# Patient Record
Sex: Male | Born: 1995 | Race: White | Hispanic: No | Marital: Single | Smoking: Never smoker
Health system: Southern US, Community
[De-identification: ages and names within clinical notes are randomized; demographics above are authoritative.]

## PROBLEM LIST (undated history)

## (undated) DIAGNOSIS — I639 Cerebral infarction, unspecified: Secondary | ICD-10-CM

## (undated) HISTORY — PX: HYDROCELE EXCISION: SHX482

---
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
## 2018-02-09 ENCOUNTER — Encounter: Payer: Self-pay | Admitting: Emergency Medicine

## 2018-02-09 DIAGNOSIS — F902 Attention-deficit hyperactivity disorder, combined type: Secondary | ICD-10-CM

## 2018-02-17 ENCOUNTER — Ambulatory Visit (INDEPENDENT_AMBULATORY_CARE_PROVIDER_SITE_OTHER): Payer: 59 | Admitting: Psychiatry

## 2018-02-17 ENCOUNTER — Encounter: Payer: Self-pay | Admitting: Psychiatry

## 2018-02-17 VITALS — BP 108/64 | HR 60 | Ht 70.0 in | Wt 142.0 lb

## 2018-02-17 DIAGNOSIS — F902 Attention-deficit hyperactivity disorder, combined type: Secondary | ICD-10-CM

## 2018-02-17 DIAGNOSIS — F88 Other disorders of psychological development: Secondary | ICD-10-CM

## 2018-02-17 MED ORDER — AMPHETAMINE-DEXTROAMPHETAMINE 15 MG PO TABS: 15.0000 mg | ORAL_TABLET | Freq: Two times a day (BID) | ORAL | 0 refills | Status: DC

## 2018-02-17 NOTE — Progress Notes (Signed)
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  He is now a Holiday representative at [employer], and sister has transferred there.  He [facility] [place]  His weight is back up after a loss of 5 pounds last appointment, now with gain of 9 pounds rendering Adderall comfortable current dosing which he creatively applies to his academic schedule for optimal success with least medication.  He did turn 22 years and has had an occasional beer is in his fraternity not currently using.  He has no vaping or other substance use.   Individual Medical History/ Review of Systems: Changes? :Yes Asthma and urticaria are stable being allergic to penicillin and albuterol, having cerebral concussion 6 years ago and a mild essential tremor particularly left more than right hand when outstretched, otherwise 2 systems negative.  He has no medications prior to Adderall.  Allergies: Albuterol and Penicillins  Current Medications:  Current Outpatient Medications:  .  amphetamine-dextroamphetamine (ADDERALL) 15 MG tablet, Take 1 tablet by mouth 2 (two) times daily., Disp: 60 tablet, Rfl: 0 .  [START ON 03/19/2018] amphetamine-dextroamphetamine (ADDERALL) 15 MG tablet, Take 1 tablet by mouth 2 (two) times daily., Disp: 60 tablet, Rfl: 0 .  [START ON 04/18/2018] amphetamine-dextroamphetamine (ADDERALL) 15 MG  tablet, Take 1 tablet by mouth 2 (two) times daily., Disp: 60 tablet, Rfl: 0 Medication Side Effects: none  Family Medical/ Social History: Changes?  Yes.  Younger sister has celiac, ADHD, anxiety and tics.  Mother has depression and ADHD.  Maternal grandfather completed suicide.  MENTAL HEALTH EXAM: Muscle strength 5/5, postural reflexes 0/0, 1/2+ hand tremor left more than right, and AIMS equals 0 Blood pressure 108/64, pulse 60, height 5\' 10"  (1.778 m), weight 142 lb (64.4 kg).Body mass index is 20.37 kg/m.  General Appearance: Casual and Fairly Groomed  Eye Contact:  Good  Speech:  Clear and Coherent  Volume:  Normal  Mood:  Euthymic  Affect:  Full Range  Thought Process:  Goal Directed  Orientation:  Full (Time, Place, and Person)  Thought Content: Rumination   Suicidal Thoughts:  No  Homicidal Thoughts:  No  Memory:  Immediate;   Fair  Judgement:  Good  Insight:  Fair  Psychomotor Activity:  Increased and Mannerisms  Concentration:  Concentration: Good and Attention Span: Fair  Recall:  [employer] of Knowledge: Good  Language: Good  Assets:  Resilience Talents/Skills Vocational/Educational  ADL's:  Intact  Cognition: WNL  Prognosis:  Good    DIAGNOSES:    ICD-10-CM   1. Attention deficit hyperactivity disorder, combined type F90.2 amphetamine-dextroamphetamine (ADDERALL) 15 MG tablet    amphetamine-dextroamphetamine (ADDERALL) 15 MG tablet    amphetamine-dextroamphetamine (ADDERALL) 15 MG tablet  2. Secondary neurodevelopmental disorder F88     Receiving Psychotherapy: No    RECOMMENDATIONS: We update all issues for completing his junior year with succesful academics to also have adaptive applications.  He processes family, fraternity, academic, and pre-employment relations and activities for any modifications.  He is prescribed to continue Adderall 15 mg IR tablet twice daily as a month supply each for November, December, and [facility] [place], to  return in 6 months   Chauncey Mann, MD

## 2018-07-28 ENCOUNTER — Telehealth: Payer: Self-pay | Admitting: Psychiatry

## 2018-07-28 DIAGNOSIS — F902 Attention-deficit hyperactivity disorder, combined type: Secondary | ICD-10-CM

## 2018-07-28 MED ORDER — AMPHETAMINE-DEXTROAMPHETAMINE 15 MG PO TABS: 15.0000 mg | ORAL_TABLET | Freq: Two times a day (BID) | ORAL | 0 refills | Status: DC

## 2018-07-28 NOTE — Addendum Note (Signed)
Addended by: Chauncey Mann on: 07/28/2018 03:33 PM   Modules accepted: Orders

## 2018-07-28 NOTE — Telephone Encounter (Signed)
Last appointment 02/18/2019 due again next month home from [employer] needing Adderall for remainder of semester online 20 mg IR tab twice daily #60 with no refill medically necessary with no contraindication [facility] [address]

## 2018-07-28 NOTE — Telephone Encounter (Signed)
Patient need refill on Adderall 15 mg. Instant release to be transferred to [facility] on [address]. patient is home from school

## 2018-11-13 ENCOUNTER — Ambulatory Visit: Payer: 59 | Admitting: Psychiatry

## 2018-11-17 ENCOUNTER — Encounter: Payer: Self-pay | Admitting: Psychiatry

## 2018-11-17 ENCOUNTER — Other Ambulatory Visit: Payer: Self-pay

## 2018-11-17 ENCOUNTER — Ambulatory Visit (INDEPENDENT_AMBULATORY_CARE_PROVIDER_SITE_OTHER): Payer: 59 | Admitting: Psychiatry

## 2018-11-17 DIAGNOSIS — F902 Attention-deficit hyperactivity disorder, combined type: Secondary | ICD-10-CM | POA: Diagnosis not present

## 2018-11-17 DIAGNOSIS — F88 Other disorders of psychological development: Secondary | ICD-10-CM

## 2018-11-17 MED ORDER — AMPHETAMINE-DEXTROAMPHETAMINE 15 MG PO TABS: 15.0000 mg | ORAL_TABLET | Freq: Two times a day (BID) | ORAL | 0 refills | Status: DC

## 2018-11-17 NOTE — Progress Notes (Signed)
[facility]  Patient ID: Dustin Jennings,  MRN: [id number]  PCP: Patient, No Pcp Per  Date of Evaluation: 11/17/2018 Time spent:10 minutes from 1450 to 1500  Chief Complaint:  Chief Complaint    ADHD; Stress      HISTORY/CURRENT STATUS: Dustin Jennings is provided telemedicine audiovisual appointment session, though he declines video being at his fraternity house though with adequate privacy, with consent with epic collateral for psychiatric interview and exam in 9-month evaluation and management of ADHD with incidental neurodevelopmental disorder of perinatal stroke.  He is 3 months overdue for follow-up but last fill of Adderall [place] was 07/28/2018 having the summer off from school and medication.  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  He moved into the fraternity house already, and 1 pledge brother had COVID exposure patient not having test but reviewing medically with his provider.  With the coronavirus pandemic, he prefers [facility].  He has no psychosis, mania, substance use, or delirium disorder.   Individual Medical History/ Review of Systems: Changes? :Yes  Weight gain noted 9 pounds doing well physically though having potential COVID exposure through the fraternity medically reviewed and cleared by [facility] urgent care.  Allergies: Albuterol and Penicillins  Current Medications:  Current Outpatient Medications:  .  amphetamine-dextroamphetamine (ADDERALL) 15 MG tablet, Take 1 tablet by mouth 2 (two) times daily., Disp: 60 tablet, Rfl: 0 .  amphetamine-dextroamphetamine (ADDERALL) 15 MG tablet, Take 1 tablet by mouth 2 (two) times daily., Disp: 60 tablet, Rfl: 0 .  amphetamine-dextroamphetamine (ADDERALL) 15 MG tablet, Take 1 tablet by mouth 2 (two) times daily for 30 days., Disp: 60 tablet, Rfl: 0   Medication Side Effects: none  Family Medical/ Social History: Changes? No  MENTAL HEALTH EXAM:  There were no vitals taken for  this visit.There is no height or weight on file to calculate BMI.  As not present in office  General Appearance: N/A  Eye Contact:  N/A  Speech:  Clear and Coherent, Normal Rate and Talkative  Volume:  Normal  Mood:  Euthymic  Affect:  Appropriate and Full Range  Thought Process:  Goal Directed  Orientation:  Full (Time, Place, and Person)  Thought Content: Logical and Obsessions   Suicidal Thoughts:  No  Homicidal Thoughts:  No  Memory:  Immediate;   Good Remote;   Good  Judgement:  Good  Insight:  Fair  Psychomotor Activity:  Normal, Increased and Mannerisms  Concentration:  Concentration: Good and Attention Span: Fair  Recall:  Good  Fund of Knowledge: Good  Language: Good  Assets:  Desire for Improvement Leisure Time Vocational/Educational  ADL's:  Intact  Cognition: WNL  Prognosis:  Good    DIAGNOSES:    ICD-10-CM   1. Attention deficit hyperactivity disorder, combined type  F90.2   2. Secondary neurodevelopmental disorder  F88     Receiving Psychotherapy: No    RECOMMENDATIONS: Psychosupportive psychoeducation updates medication and academic behavioral management of senior year and beyond.  Symptom treatment matching concludes to continue Adderall 15 mg IR twice daily E scribed #60 each for August 3, September 2, and October 2 [facility] [address] for ADHD.  He returns in 6 months for follow-up or sooner if needed.  Virtual Visit via Video Note  I connected with Dustin Jennings on 11/17/18 at  2:40 PM EDT by a video enabled telemedicine application and verified that I am speaking with the  correct person using two identifiers.  Location: Patient: Individually at fraternity house in private [facility]. Provider: [facility]   I discussed the limitations of evaluation and management by telemedicine and the availability of in person appointments. The patient expressed understanding and agreed to proceed.  History of  Present Illness: 106-month evaluation and management address ADHD with incidental neurodevelopmental disorder of perinatal stroke.  He is 3 months overdue for follow-up but last fill of Adderall per [facility] registry was 07/28/2018 having the summer off from school and medication.   Observations/Objective: Memory:  Immediate;   Good Remote;   Good  Judgement:  Good  Insight:  Fair  Psychomotor Activity:  Normal, Increased and Mannerisms  Concentration:  Concentration: Good and Attention Span: Fair   Assessment and Plan: Psychosupportive psychoeducation updates medication and academic behavioral management of senior year and beyond.  Symptom treatment matching concludes to continue Adderall 15 mg IR twice daily E scribed #60 each for August 3, September 2, and October [facility] [address] for ADHD.  Follow up: He returns in 6 months for follow-up or sooner if needed.    I discussed the assessment and treatment plan with the patient. The patient was provided an opportunity to ask questions and all were answered. The patient agreed with the plan and demonstrated an understanding of the instructions.   The patient was advised to call back or seek an in-person evaluation if the symptoms worsen or if the condition fails to improve as anticipated.  I provided 10 minutes of non-face-to-face time during this encounter. [employer] meeting #[id number]#[phone] Meeting password: [email or web address]  Dustin MannGlenn E Jennings, MD   Dustin MannGlenn E Jennings, MD

## 2019-02-10 ENCOUNTER — Telehealth: Payer: Self-pay | Admitting: Psychiatry

## 2019-02-11 NOTE — Telephone Encounter (Signed)
error 

## 2019-04-28 ENCOUNTER — Other Ambulatory Visit: Payer: Managed Care, Other (non HMO)

## 2019-04-29 ENCOUNTER — Ambulatory Visit: Payer: Managed Care, Other (non HMO) | Attending: Internal Medicine

## 2019-04-29 DIAGNOSIS — Z20822 Contact with and (suspected) exposure to covid-19: Secondary | ICD-10-CM

## 2019-04-30 LAB — NOVEL CORONAVIRUS, NAA: SARS-CoV-2, NAA: NOT DETECTED

## 2019-10-22 ENCOUNTER — Other Ambulatory Visit: Payer: Self-pay

## 2019-10-22 ENCOUNTER — Encounter: Payer: Self-pay | Admitting: Psychiatry

## 2019-10-22 ENCOUNTER — Ambulatory Visit (INDEPENDENT_AMBULATORY_CARE_PROVIDER_SITE_OTHER): Payer: 59 | Admitting: Psychiatry

## 2019-10-22 VITALS — Ht 70.0 in | Wt 152.0 lb

## 2019-10-22 DIAGNOSIS — F902 Attention-deficit hyperactivity disorder, combined type: Secondary | ICD-10-CM

## 2019-10-22 DIAGNOSIS — F88 Other disorders of psychological development: Secondary | ICD-10-CM | POA: Diagnosis not present

## 2019-10-22 MED ORDER — AMPHETAMINE-DEXTROAMPHETAMINE 15 MG PO TABS: 15.0000 mg | ORAL_TABLET | Freq: Two times a day (BID) | ORAL | 0 refills | Status: DC

## 2019-10-22 NOTE — Progress Notes (Signed)
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  Patient continues to adapt effectively graduating in May from his undergraduate status at [employer] now getting a masters in accounting that is consolidated to a single year to graduate in June 2022.  In his senior year, he had only 3 courses.  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  He has an 8 AM class in his masters program and does need Adderall for that although he has filled the third of the 3 eScription's from 12/14/2018 appointment on 05/07/2019 therefore needing progressively less Adderall over the time course of college.  He finds the current dose has been more helpful than he expected, initiating and completing daily work taking the Adderall 15 mg IR twice daily, which he doubted would be sufficient initially. He has no mania, suicidality, psychosis or delirium.   Individual Medical History/ Review of Systems: Changes? :Yes 10 pound weight gain in the last 15 months feeling and functioning well though currently having months to vision over cognitive testing moving out to campus housing with 7 fraternity brothers until graduation now in a new current residence working on his masters.  Last appointment was online.  Allergies: Albuterol and Penicillins  Current Medications:  Current Outpatient Medications:  .  amphetamine-dextroamphetamine (ADDERALL) 15 MG tablet, Take 1 tablet by mouth  2 (two) times daily., Disp: 60 tablet, Rfl: 0 .  [START ON 11/21/2019] amphetamine-dextroamphetamine (ADDERALL) 15 MG tablet, Take 1 tablet by mouth 2 (two) times daily., Disp: 60 tablet, Rfl: 0 .  [START ON 12/21/2019] amphetamine-dextroamphetamine (ADDERALL) 15 MG tablet, Take 1 tablet by mouth 2 (two) times daily., Disp: 60 tablet, Rfl: 0  Medication Side Effects: none  Family Medical/ Social History: Changes? Yes,  sister doing well having transferred to [facility] [employer] she continues to meet her college education expectations at times in other [place] locations.  MENTAL HEALTH EXAM:  Height 5\' 10"  (1.778 m), weight 152 lb (68.9 kg).Body mass index is 21.81 kg/m. Muscle strengths and tone 5/5, postural reflexes and gait 0/0, and AIMS = 0.  General Appearance: Casual, Meticulous and Well Groomed  Eye Contact:  Good  Speech:  Clear and Coherent, Normal Rate and Talkative  Volume:  Normal  Mood:  Euthymic  Affect:  Full Range  Thought Process:  Goal Directed  Orientation:  Full (Time, Place, and Person)  Thought Content: Logical   Suicidal Thoughts:  No  Homicidal Thoughts:  No  Memory:  Immediate;   Good Remote;   Good  Judgement:  Good  Insight:  Fair  Psychomotor Activity:  Normal and Mannerisms  Concentration:  Concentration: Good and Attention Span: Good to Fair  Recall:  Good  Fund of Knowledge: Good  Language: Good  Assets:  Desire for Improvement Intimacy Resilience Vocational/Educational  ADL's:  Intact  Cognition: WNL  Prognosis:  Good    DIAGNOSES:    ICD-10-CM   1. Attention deficit hyperactivity disorder, combined type  F90.2 amphetamine-dextroamphetamine (  ADDERALL) 15 MG tablet    amphetamine-dextroamphetamine (ADDERALL) 15 MG tablet    amphetamine-dextroamphetamine (ADDERALL) 15 MG tablet  2. Secondary neurodevelopmental disorder  F88     Receiving Psychotherapy: No    RECOMMENDATIONS: Psychosupportive psychoeducation concludes expectation for continued  Adderall through the masters program, though patient does not use it every day.  Patient can match treatment expectations to the medication for courses and structures.  He is E scribed Adderall 15 mg IR to take 1 tablet twice daily #60 each for July 8, August 7, and September 6 for ADHD [facility] in [place].  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  He return for follow-up in 6 months or sooner if needed.   Dustin Mann, MD

## 2020-02-03 ENCOUNTER — Encounter: Payer: Self-pay | Admitting: Psychiatry

## 2020-06-16 ENCOUNTER — Other Ambulatory Visit: Payer: Self-pay | Admitting: Adult Health

## 2020-06-16 ENCOUNTER — Telehealth: Payer: Self-pay | Admitting: Adult Health

## 2020-06-16 DIAGNOSIS — F902 Attention-deficit hyperactivity disorder, combined type: Secondary | ICD-10-CM

## 2020-06-16 MED ORDER — AMPHETAMINE-DEXTROAMPHETAMINE 15 MG PO TABS: 15.0000 mg | ORAL_TABLET | Freq: Two times a day (BID) | ORAL | 0 refills | Status: DC

## 2020-06-16 NOTE — Telephone Encounter (Signed)
Script sent  

## 2020-06-16 NOTE — Telephone Encounter (Signed)
PT has set up appt for f/up 4/5. [facility] , [address] [place].

## 2020-07-19 ENCOUNTER — Ambulatory Visit: Payer: 59 | Admitting: Adult Health

## 2020-08-05 ENCOUNTER — Ambulatory Visit: Payer: 59 | Admitting: Adult Health

## 2020-08-26 ENCOUNTER — Ambulatory Visit: Payer: 59 | Admitting: Adult Health

## 2020-09-09 ENCOUNTER — Ambulatory Visit: Payer: 59 | Admitting: Adult Health

## 2020-09-30 ENCOUNTER — Encounter: Payer: Self-pay | Admitting: Adult Health

## 2020-09-30 ENCOUNTER — Ambulatory Visit (INDEPENDENT_AMBULATORY_CARE_PROVIDER_SITE_OTHER): Payer: 59 | Admitting: Adult Health

## 2020-09-30 ENCOUNTER — Other Ambulatory Visit: Payer: Self-pay

## 2020-09-30 VITALS — BP 130/90 | HR 75

## 2020-09-30 DIAGNOSIS — F902 Attention-deficit hyperactivity disorder, combined type: Secondary | ICD-10-CM

## 2020-09-30 MED ORDER — AMPHETAMINE-DEXTROAMPHETAMINE 15 MG PO TABS: 15.0000 mg | ORAL_TABLET | Freq: Two times a day (BID) | ORAL | 0 refills | Status: DC

## 2020-09-30 NOTE — Progress Notes (Signed)
Dustin Jennings [id number] 1995-07-27 25 y.o.  Subjective:   Patient ID:  Dustin Jennings is a 25 y.o. (DOB [birth year]) male.  Chief Complaint: No chief complaint on file.   HPI Dustin Jennings presents to the office today for follow-up of ADHD.  Describes mood today as "ok". Pleasant- Mood symptoms - denies depression, anxiety, and irritability. Stating "I', doing great". Recently finished master's in accounting at [employer]. Feels like current medication works well for him. Stable interest and motivation. Taking medications as prescribed.  Energy levels stable. Active, has a regular exercise routine. Enjoys some usual interests and activities. Single. Lives with parents. Moving out in August. Spending time with family. Appetite adequate. Weight stable. Sleeps well most nights. Averages 9 hours. Focus and concentration stable with Adderall. Completing tasks. Managing aspects of household. Recently graduated from [employer]. Starting a full time job in August.  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  Denies AH or VH.  Previous medication trials:  Denies  Review of Systems:  Review of Systems  Musculoskeletal:  Negative for gait problem.  Neurological:  Negative for tremors.  Psychiatric/Behavioral:         Please refer to HPI   Medications: I have reviewed the patient's current medications.  Current Outpatient Medications  Medication Sig Dispense Refill   amphetamine-dextroamphetamine (ADDERALL) 15 MG tablet Take 1 tablet by mouth 2 (two) times daily. 60 tablet 0   amphetamine-dextroamphetamine (ADDERALL) 15 MG tablet Take 1 tablet by mouth 2 (two) times daily. 60 tablet 0   [START ON 10/28/2020] amphetamine-dextroamphetamine (ADDERALL) 15 MG tablet Take 1 tablet by mouth 2 (two) times daily. 60 tablet 0   No current facility-administered medications for this visit.    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   Please see review of systems for further details on the patient's review from today.   Objective:   Physical Exam:  BP 130/90   Pulse 75   Physical Exam Constitutional:      General: He is not in acute distress. Musculoskeletal:        General: No deformity.  Neurological:     Mental Status: He is alert and oriented to person, place, and time.     Coordination: Coordination normal.  Psychiatric:        Attention and Perception: Attention and perception normal. He does not perceive auditory or visual hallucinations.        Mood and Affect: Mood normal. Mood is not anxious or depressed. Affect is not labile, blunt, angry or inappropriate.        Speech: Speech normal.        Behavior: Behavior normal.        Thought Content: Thought content normal. Thought content is not paranoid or delusional. Thought content does not include homicidal or suicidal ideation. Thought content does not include homicidal or suicidal plan.        Cognition and Memory: Cognition and memory normal.        Judgment: Judgment normal.     Comments: Insight intact    Lab Review:  No results  found for: NA, K, CL, CO2, GLUCOSE, BUN, CREATININE, CALCIUM, PROT, ALBUMIN, AST, ALT, ALKPHOS, BILITOT, GFRNONAA, GFRAA  No results found for: WBC, RBC, HGB, HCT, PLT, MCV, MCH, MCHC, RDW, LYMPHSABS, MONOABS, EOSABS, BASOSABS  No results found for: POCLITH, LITHIUM   No results found for: PHENYTOIN, PHENOBARB, VALPROATE, CBMZ   .res Assessment: Plan:    Plan:  PDMP reviewed  Adderall 15mg  BID   130/90/75  RTC 6 months  Patient advised to contact office with any questions, adverse effects, or acute worsening in signs and  symptoms.    Diagnoses and all orders for this visit:  Attention deficit hyperactivity disorder, combined type -     amphetamine-dextroamphetamine (ADDERALL) 15 MG tablet; Take 1 tablet by mouth 2 (two) times daily. -     amphetamine-dextroamphetamine (ADDERALL) 15 MG tablet; Take 1 tablet by mouth 2 (two) times daily.    Please see After Visit Summary for patient specific instructions.  No future appointments.  No orders of the defined types were placed in this encounter.   -------------------------------

## 2021-03-23 ENCOUNTER — Telehealth: Payer: Self-pay | Admitting: Adult Health

## 2021-03-23 NOTE — Telephone Encounter (Signed)
Next visit is 04/04/21. Dustin Jennings is requesting a refill on his Adderall 15 mg. The pharmacy that currently has #40 pills in stock [facility]:  [facility], [address], [address], [place], [place] [address]. Phone number is [phone].

## 2021-03-27 NOTE — Telephone Encounter (Signed)
Called patient to see if he had found a pharmacy with medication in stock and he said he had not.

## 2021-04-04 ENCOUNTER — Encounter: Payer: Self-pay | Admitting: Adult Health

## 2021-04-04 ENCOUNTER — Telehealth (INDEPENDENT_AMBULATORY_CARE_PROVIDER_SITE_OTHER): Payer: 59 | Admitting: Adult Health

## 2021-04-04 DIAGNOSIS — F902 Attention-deficit hyperactivity disorder, combined type: Secondary | ICD-10-CM

## 2021-04-04 MED ORDER — AMPHETAMINE-DEXTROAMPHETAMINE 15 MG PO TABS: 15.0000 mg | ORAL_TABLET | Freq: Two times a day (BID) | ORAL | 0 refills | Status: DC

## 2021-04-04 NOTE — Progress Notes (Signed)
Dustin Jennings [id number] 12-Jan-1996 25 y.o.  Virtual Visit via Video Note  I connected with pt @ on 04/04/21 at  3:00 PM EST by a video enabled telemedicine application and verified that I am speaking with the correct person using two identifiers.   I discussed the limitations of evaluation and management by telemedicine and the availability of in person appointments. The patient expressed understanding and agreed to proceed.  I discussed the assessment and treatment plan with the patient. The patient was provided an opportunity to ask questions and all were answered. The patient agreed with the plan and demonstrated an understanding of the instructions.   The patient was advised to call back or seek an in-person evaluation if the symptoms worsen or if the condition fails to improve as anticipated.  I provided 10 minutes of non-face-to-face time during this encounter.  The patient was located at home.  The provider was located at [facility].   Dorothyann Gibbs, NP   Subjective:   Patient ID:  Dustin Jennings is a 25 y.o. (DOB 01-17-1996) male.  Chief Complaint: No chief complaint on file.   HPI Dustin Jennings presents for follow-up of ADHD.  Describes mood today as "ok". Pleasant. Denies tearfulness. Mood symptoms - denies depression, anxiety, and irritability. Stating "I'm doing good". Feels like current medication works well for him. Stable interest and motivation. Taking medications as prescribed.  Energy levels stable. Active, has a regular exercise routine. Enjoys some usual interests and activities. Single. Lives with two roommates in [place]. Spending time with family. Appetite adequate. Weight stable. Sleeps well most nights. Averages 8 hours. Focus and concentration stable with Adderall. Completing tasks. Managing aspects of household. Working as an Airline pilot. 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  Denies AH or VH.  Previous medication trials:  Denies   Review of Systems:   Review of Systems  Musculoskeletal:  Negative for gait problem.  Neurological:  Negative for tremors.  Psychiatric/Behavioral:         Please refer to HPI   Medications: I have reviewed the patient's current medications.  Current Outpatient Medications  Medication Sig Dispense Refill   amphetamine-dextroamphetamine (ADDERALL) 15 MG tablet Take 1 tablet by mouth 2 (two) times daily. 60 tablet 0   amphetamine-dextroamphetamine (ADDERALL) 15 MG tablet Take 1 tablet by mouth 2 (two) times daily. 60 tablet 0   amphetamine-dextroamphetamine (ADDERALL) 15 MG tablet Take 1 tablet by mouth 2 (two) times daily. 60 tablet 0   No current facility-administered medications for this visit.    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  Social History   Socioeconomic History   Marital status: Single    Spouse name: Not on file   Number of children: Not on file   Years of education: Not on file   Highest education level: Not on file  Occupational History   Not on file  Tobacco Use   Smoking status: Never   Smokeless tobacco: Never  Vaping Use   Vaping Use: Never used  Substance and Sexual Activity   Alcohol use: Yes    Alcohol/week: 2.0 standard drinks    Types: 2 Cans of beer per week  Comment: weekly   Drug use: Never   Sexual activity: Not on file  Other Topics Concern   Not on file  Social History Narrative   Not on file   Social Determinants of Health   Financial Resource Strain: Not on file  Food Insecurity: Not on file  Transportation Needs: Not on file  Physical Activity: Not on file  Stress: Not on file  Social Connections: Not on file  Intimate Partner Violence: Not on file    Past  Medical History, Surgical history, Social history, and Family history were reviewed and updated as appropriate.   Please see review of systems for further details on the patient's review from today.   Objective:   Physical Exam:  There were no vitals taken for this visit.  Physical Exam Constitutional:      General: He is not in acute distress. Musculoskeletal:        General: No deformity.  Neurological:     Mental Status: He is alert and oriented to person, place, and time.     Coordination: Coordination normal.  Psychiatric:        Attention and Perception: Attention and perception normal. He does not perceive auditory or visual hallucinations.        Mood and Affect: Mood normal. Mood is not anxious or depressed. Affect is not labile, blunt, angry or inappropriate.        Speech: Speech normal.        Behavior: Behavior normal.        Thought Content: Thought content normal. Thought content is not paranoid or delusional. Thought content does not include homicidal or suicidal ideation. Thought content does not include homicidal or suicidal plan.        Cognition and Memory: Cognition and memory normal.        Judgment: Judgment normal.     Comments: Insight intact    Lab Review:  No results found for: NA, K, CL, CO2, GLUCOSE, BUN, CREATININE, CALCIUM, PROT, ALBUMIN, AST, ALT, ALKPHOS, BILITOT, GFRNONAA, GFRAA  No results found for: WBC, RBC, HGB, HCT, PLT, MCV, MCH, MCHC, RDW, LYMPHSABS, MONOABS, EOSABS, BASOSABS  No results found for: POCLITH, LITHIUM   No results found for: PHENYTOIN, PHENOBARB, VALPROATE, CBMZ   .res Assessment: Plan:    Plan:  PDMP reviewed  Adderall 15mg  BID   RTC 6 months  Patient advised to contact office with any questions, adverse effects, or acute worsening in signs and symptoms. Diagnoses and all orders for this visit:  Attention deficit hyperactivity disorder, combined type -     amphetamine-dextroamphetamine (ADDERALL) 15 MG tablet;  Take 1 tablet by mouth 2 (two) times daily.     Please see After Visit Summary for patient specific instructions.  No future appointments.  No orders of the defined types were placed in this encounter.     -------------------------------

## 2021-07-05 ENCOUNTER — Other Ambulatory Visit: Payer: Self-pay

## 2021-07-05 ENCOUNTER — Telehealth: Payer: Self-pay | Admitting: Adult Health

## 2021-07-05 DIAGNOSIS — F902 Attention-deficit hyperactivity disorder, combined type: Secondary | ICD-10-CM

## 2021-07-05 MED ORDER — AMPHETAMINE-DEXTROAMPHETAMINE 15 MG PO TABS: 15.0000 mg | ORAL_TABLET | Freq: Two times a day (BID) | ORAL | 0 refills | Status: DC

## 2021-07-05 NOTE — Telephone Encounter (Signed)
Pended.

## 2021-07-05 NOTE — Telephone Encounter (Signed)
Pt called at 11:00 am  and asked for a refill on his adderall 15 mg. [facility] [address] in [place]. Pharmacy has it in stock ?

## 2022-01-19 ENCOUNTER — Encounter: Payer: Self-pay | Admitting: Adult Health

## 2022-01-19 ENCOUNTER — Telehealth (INDEPENDENT_AMBULATORY_CARE_PROVIDER_SITE_OTHER): Payer: 59 | Admitting: Adult Health

## 2022-01-19 DIAGNOSIS — F902 Attention-deficit hyperactivity disorder, combined type: Secondary | ICD-10-CM

## 2022-01-19 MED ORDER — AMPHETAMINE-DEXTROAMPHETAMINE 15 MG PO TABS: 15.0000 mg | ORAL_TABLET | Freq: Two times a day (BID) | ORAL | 0 refills | Status: DC

## 2022-01-19 NOTE — Progress Notes (Signed)
Dustin Jennings [id number] 01-04-1996 26 y.o.  Virtual Visit via Video Note  I connected with pt @ on 01/19/22 at  8:20 AM EDT by a video enabled telemedicine application and verified that I am speaking with the correct person using two identifiers.   I discussed the limitations of evaluation and management by telemedicine and the availability of in person appointments. The patient expressed understanding and agreed to proceed.  I discussed the assessment and treatment plan with the patient. The patient was provided an opportunity to ask questions and all were answered. The patient agreed with the plan and demonstrated an understanding of the instructions.   The patient was advised to call back or seek an in-person evaluation if the symptoms worsen or if the condition fails to improve as anticipated.  I provided 10 minutes of non-face-to-face time during this encounter.  The patient was located at home.  The provider was located at [place].   Aloha Gell, NP   Subjective:   Patient ID:  Dustin Jennings is a 26 y.o. (DOB March 17, 1996) male.  Chief Complaint: No chief complaint on file.   HPI Dustin Jennings presents for follow-up of ADHD.  Describes mood today as "ok". Pleasant. Denies tearfulness. Mood symptoms - denies depression, anxiety, and irritability. Mood is consistent. Stating "I'm doing fine". Feels like current medication works well. Stable interest and motivation. Taking medications as prescribed.  Energy levels stable. Active, has a regular exercise routine - 4 x a week. Enjoys some usual interests and activities. Lives with girlfriend of 9 years in [place]. Spending time with family. Appetite adequate. Weight stable. Sleeps well most nights. Averages 8 hours. Focus and concentration stable with Adderall. Completing tasks. Managing aspects of household. Working as an Optometrist. 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  Denies AH or VH.  Previous medication trials:   Denies    Review of Systems:  Review of Systems  Musculoskeletal:  Negative for gait problem.  Neurological:  Negative for tremors.  Psychiatric/Behavioral:         Please refer to HPI    Medications: I have reviewed the patient's current medications.  Current Outpatient Medications  Medication Sig Dispense Refill   amphetamine-dextroamphetamine (ADDERALL) 15 MG tablet Take 1 tablet by mouth 2 (two) times daily. 60 tablet 0   amphetamine-dextroamphetamine (ADDERALL) 15 MG tablet Take 1 tablet by mouth 2 (two) times daily. 60 tablet 0   amphetamine-dextroamphetamine (ADDERALL) 15 MG tablet Take 1 tablet by mouth 2 (two) times daily. 60 tablet 0   No current facility-administered medications for this visit.    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  Social History   Socioeconomic History   Marital status: Single    Spouse name: Not on file   Number of children: Not on file   Years of education: Not on file   Highest education level: Not on file  Occupational History   Not on file  Tobacco Use   Smoking status: Never   Smokeless tobacco: Never  Vaping Use   Vaping Use: Never used  Substance and Sexual Activity   Alcohol use: Yes    Alcohol/week: 2.0 standard drinks of alcohol  Types: 2 Cans of beer per week    Comment: weekly   Drug use: Never   Sexual activity: Not on file  Other Topics Concern   Not on file  Social History Narrative   Not on file   Social Determinants of Health   Financial Resource Strain: Not on file  Food Insecurity: Not on file  Transportation Needs: Not on file  Physical Activity: Not on file  Stress: Not on file  Social Connections: Not on file  Intimate  Partner Violence: Not on file    Past Medical History, Surgical history, Social history, and Family history were reviewed and updated as appropriate.   Please see review of systems for further details on the patient's review from today.   Objective:   Physical Exam:  There were no vitals taken for this visit.  Physical Exam Constitutional:      General: He is not in acute distress. Musculoskeletal:        General: No deformity.  Neurological:     Mental Status: He is alert and oriented to person, place, and time.     Coordination: Coordination normal.  Psychiatric:        Attention and Perception: Attention and perception normal. He does not perceive auditory or visual hallucinations.        Mood and Affect: Mood normal. Mood is not anxious or depressed. Affect is not labile, blunt, angry or inappropriate.        Speech: Speech normal.        Behavior: Behavior normal.        Thought Content: Thought content normal. Thought content is not paranoid or delusional. Thought content does not include homicidal or suicidal ideation. Thought content does not include homicidal or suicidal plan.        Cognition and Memory: Cognition and memory normal.        Judgment: Judgment normal.     Comments: Insight intact     Lab Review:  No results found for: "NA", "K", "CL", "CO2", "GLUCOSE", "BUN", "CREATININE", "CALCIUM", "PROT", "ALBUMIN", "AST", "ALT", "ALKPHOS", "BILITOT", "GFRNONAA", "GFRAA"  No results found for: "WBC", "RBC", "HGB", "HCT", "PLT", "MCV", "MCH", "MCHC", "RDW", "LYMPHSABS", "MONOABS", "EOSABS", "BASOSABS"  No results found for: "POCLITH", "LITHIUM"   No results found for: "PHENYTOIN", "PHENOBARB", "VALPROATE", "CBMZ"   .res Assessment: Plan:    Plan:  PDMP reviewed  Adderall 15mg  BID   RTC 6 months  Patient advised to contact office with any questions, adverse effects, or acute worsening in signs and symptoms.   Time spent with patient was 15 minutes.  Greater than 50% of face to face time with patient was spent on counseling and coordination of care.    Diagnoses and all orders for this visit:  Attention deficit hyperactivity disorder, combined type     Please see After Visit Summary for patient specific instructions.  Future Appointments  Date Time Provider [place]  01/19/2022  8:20 AM Shanyah Gattuso, Berdie Ogren, NP CP-CP None    No orders of the defined types were placed in this encounter.     -------------------------------

## 2022-04-17 ENCOUNTER — Telehealth: Payer: Self-pay | Admitting: Adult Health

## 2022-04-17 ENCOUNTER — Other Ambulatory Visit: Payer: Self-pay

## 2022-04-17 DIAGNOSIS — F902 Attention-deficit hyperactivity disorder, combined type: Secondary | ICD-10-CM

## 2022-04-17 MED ORDER — AMPHETAMINE-DEXTROAMPHETAMINE 15 MG PO TABS: 15.0000 mg | ORAL_TABLET | Freq: Two times a day (BID) | ORAL | 0 refills | Status: DC

## 2022-04-17 NOTE — Telephone Encounter (Signed)
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  Appt 4/10/24l  [facility] [address] [address]

## 2022-04-17 NOTE — Telephone Encounter (Signed)
Pended.

## 2022-06-06 ENCOUNTER — Telehealth: Payer: Self-pay | Admitting: Adult Health

## 2022-06-06 ENCOUNTER — Other Ambulatory Visit: Payer: Self-pay

## 2022-06-06 DIAGNOSIS — F902 Attention-deficit hyperactivity disorder, combined type: Secondary | ICD-10-CM

## 2022-06-06 NOTE — Telephone Encounter (Signed)
Pended.

## 2022-06-06 NOTE — Telephone Encounter (Signed)
Pt called and asked for a refill on his adderall 15 mg. [facility] [address]

## 2022-06-07 MED ORDER — AMPHETAMINE-DEXTROAMPHETAMINE 15 MG PO TABS: 15.0000 mg | ORAL_TABLET | Freq: Two times a day (BID) | ORAL | 0 refills | Status: DC

## 2022-07-25 ENCOUNTER — Telehealth (INDEPENDENT_AMBULATORY_CARE_PROVIDER_SITE_OTHER): Payer: Self-pay | Admitting: Adult Health

## 2022-07-25 ENCOUNTER — Encounter: Payer: Self-pay | Admitting: Adult Health

## 2022-07-25 DIAGNOSIS — F902 Attention-deficit hyperactivity disorder, combined type: Secondary | ICD-10-CM

## 2022-07-25 MED ORDER — AMPHETAMINE-DEXTROAMPHETAMINE 15 MG PO TABS: 15.0000 mg | ORAL_TABLET | Freq: Two times a day (BID) | ORAL | 0 refills | Status: DC

## 2022-07-25 MED ORDER — AMPHETAMINE-DEXTROAMPHETAMINE 15 MG PO TABS
15.0000 mg | ORAL_TABLET | Freq: Two times a day (BID) | ORAL | 0 refills | Status: DC
Start: 2022-09-19 — End: 2023-01-28

## 2022-07-25 NOTE — Progress Notes (Signed)
Dustin Jennings [id number] [birth year] 27 y.o.  Virtual Visit via Video Note  I connected with pt @ on 07/25/22 at  8:40 AM EDT by a video enabled telemedicine application and verified that I am speaking with the correct person using two identifiers.   I discussed the limitations of evaluation and management by telemedicine and the availability of in person appointments. The patient expressed understanding and agreed to proceed.  I discussed the assessment and treatment plan with the patient. The patient was provided an opportunity to ask questions and all were answered. The patient agreed with the plan and demonstrated an understanding of the instructions.   The patient was advised to call back or seek an in-person evaluation if the symptoms worsen or if the condition fails to improve as anticipated.  I provided 10 minutes of non-face-to-face time during this encounter.  The patient was located at home.  The provider was located at [facility].   Dorothyann Gibbs, NP   Subjective:   Patient ID:  Dustin Jennings is a 27 y.o. (DOB 16-Apr-1996) male.  Chief Complaint: No chief complaint on file.   HPI JERRID CABBAGE presents for follow-up of ADHD.  Describes mood today as "ok". Pleasant. Denies tearfulness. Mood symptoms - denies depression, anxiety, and irritability. Mood is consistent. Stating "I'm doing good". Feels like current medication works well. Stable interest and motivation. Taking medications as prescribed.  Energy levels stable. Active, has a regular exercise routine - 4 x a week. Enjoys some usual interests and activities. Lives with girlfriend of 10 years in [place]. Spending time with family. Appetite adequate. Weight stable - 160 pounds - 69". Sleeps well most nights. Averages 8 hours. Focus and concentration stable with Adderall. Completing tasks. Managing aspects of household. 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  Denies AH or VH. Denies self  harm. Denies substance use.   Previous medication trials:  Denies   Review of Systems:  Review of Systems  Musculoskeletal:  Negative for gait problem.  Neurological:  Negative for tremors.  Psychiatric/Behavioral:         Please refer to HPI    Medications: I have reviewed the patient's current medications.  Current Outpatient Medications  Medication Sig Dispense Refill   amphetamine-dextroamphetamine (ADDERALL) 15 MG tablet Take 1 tablet by mouth 2 (two) times daily. 60 tablet 0   amphetamine-dextroamphetamine (ADDERALL) 15 MG tablet Take 1 tablet by mouth 2 (two) times daily. 60 tablet 0   amphetamine-dextroamphetamine (ADDERALL) 15 MG tablet Take 1 tablet by mouth 2 (two) times daily. 60 tablet 0   No current facility-administered medications for this visit.    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  Social History   Socioeconomic History   Marital status: Single    Spouse name: Not on file   Number of children: Not on file   Years of education: Not on file   Highest education level: Not on file  Occupational History   Not on file  Tobacco Use   Smoking status: Never   Smokeless tobacco: Never  Vaping Use   Vaping Use: Never used  Substance and Sexual Activity  Alcohol use: Yes    Alcohol/week: 2.0 standard drinks of alcohol    Types: 2 Cans of beer per week    Comment: weekly   Drug use: Never   Sexual activity: Not on file  Other Topics Concern   Not on file  Social History Narrative   Not on file   Social Determinants of Health   Financial Resource Strain: Not on file  Food Insecurity: Not on file  Transportation Needs: Not on file  Physical Activity: Not on file   Stress: Not on file  Social Connections: Not on file  Intimate Partner Violence: Not on file    Past Medical History, Surgical history, Social history, and Family history were reviewed and updated as appropriate.   Please see review of systems for further details on the patient's review from today.   Objective:   Physical Exam:  There were no vitals taken for this visit.  Physical Exam Constitutional:      General: He is not in acute distress. Musculoskeletal:        General: No deformity.  Neurological:     Mental Status: He is alert and oriented to person, place, and time.     Coordination: Coordination normal.  Psychiatric:        Attention and Perception: Attention and perception normal. He does not perceive auditory or visual hallucinations.        Mood and Affect: Mood normal. Mood is not anxious or depressed. Affect is not labile, blunt, angry or inappropriate.        Speech: Speech normal.        Behavior: Behavior normal.        Thought Content: Thought content normal. Thought content is not paranoid or delusional. Thought content does not include homicidal or suicidal ideation. Thought content does not include homicidal or suicidal plan.        Cognition and Memory: Cognition and memory normal.        Judgment: Judgment normal.     Comments: Insight intact     Lab Review:  No results found for: "NA", "K", "CL", "CO2", "GLUCOSE", "BUN", "CREATININE", "CALCIUM", "PROT", "ALBUMIN", "AST", "ALT", "ALKPHOS", "BILITOT", "GFRNONAA", "GFRAA"  No results found for: "WBC", "RBC", "HGB", "HCT", "PLT", "MCV", "MCH", "MCHC", "RDW", "LYMPHSABS", "MONOABS", "EOSABS", "BASOSABS"  No results found for: "POCLITH", "LITHIUM"   No results found for: "PHENYTOIN", "PHENOBARB", "VALPROATE", "CBMZ"   .res Assessment: Plan:    Plan:  PDMP reviewed  Adderall 15mg  BID   RTC 6 months - will call in 3 months for next set of refills.   Patient advised to contact office with any  questions, adverse effects, or acute worsening in signs and symptoms.  Discussed potential benefits, risks, and side effects of stimulants with patient to include increased heart rate, palpitations, insomnia, increased anxiety, increased irritability, or decreased appetite.  Instructed patient to contact office if experiencing any significant tolerability issues.   Time spent with patient was 15 minutes. Greater than 50% of face to face time with patient was spent on counseling and coordination of care.    There are no diagnoses linked to this encounter.   Please see After Visit Summary for patient specific instructions.  No future appointments.  No orders of the defined types were placed in this encounter.     -------------------------------

## 2022-11-14 ENCOUNTER — Telehealth: Payer: Self-pay | Admitting: Adult Health

## 2022-11-14 ENCOUNTER — Other Ambulatory Visit: Payer: Self-pay

## 2022-11-14 DIAGNOSIS — F902 Attention-deficit hyperactivity disorder, combined type: Secondary | ICD-10-CM

## 2022-11-14 MED ORDER — AMPHETAMINE-DEXTROAMPHETAMINE 15 MG PO TABS
15.0000 mg | ORAL_TABLET | Freq: Two times a day (BID) | ORAL | 0 refills | Status: DC
Start: 2022-11-14 — End: 2023-01-28

## 2022-11-14 NOTE — Telephone Encounter (Signed)
Pt called requesting a refill on his adderall 15 mg. [facility] [address] in [place], [place]

## 2022-11-14 NOTE — Telephone Encounter (Signed)
Pended.

## 2023-01-23 ENCOUNTER — Telehealth: Payer: Managed Care, Other (non HMO) | Admitting: Adult Health

## 2023-01-28 ENCOUNTER — Encounter: Payer: Self-pay | Admitting: Adult Health

## 2023-01-28 ENCOUNTER — Telehealth: Payer: Managed Care, Other (non HMO) | Admitting: Adult Health

## 2023-01-28 DIAGNOSIS — F909 Attention-deficit hyperactivity disorder, unspecified type: Secondary | ICD-10-CM

## 2023-01-28 DIAGNOSIS — F902 Attention-deficit hyperactivity disorder, combined type: Secondary | ICD-10-CM

## 2023-01-28 MED ORDER — AMPHETAMINE-DEXTROAMPHETAMINE 15 MG PO TABS
15.0000 mg | ORAL_TABLET | Freq: Two times a day (BID) | ORAL | 0 refills | Status: DC
Start: 2023-03-25 — End: 2023-06-17

## 2023-01-28 MED ORDER — AMPHETAMINE-DEXTROAMPHETAMINE 15 MG PO TABS
15.0000 mg | ORAL_TABLET | Freq: Two times a day (BID) | ORAL | 0 refills | Status: DC
Start: 2023-01-28 — End: 2023-05-14

## 2023-01-28 MED ORDER — AMPHETAMINE-DEXTROAMPHETAMINE 15 MG PO TABS
15.0000 mg | ORAL_TABLET | Freq: Two times a day (BID) | ORAL | 0 refills | Status: DC
Start: 2023-02-25 — End: 2023-06-17

## 2023-01-28 NOTE — Progress Notes (Signed)
Dustin Jennings [id number] January 24, 1996 27 y.o.  Virtual Visit via Video Note  I connected with pt @ on 01/28/23 at  8:40 AM EDT by a video enabled telemedicine application and verified that I am speaking with the correct person using two identifiers.   I discussed the limitations of evaluation and management by telemedicine and the availability of in person appointments. The patient expressed understanding and agreed to proceed.  I discussed the assessment and treatment plan with the patient. The patient was provided an opportunity to ask questions and all were answered. The patient agreed with the plan and demonstrated an understanding of the instructions.   The patient was advised to call back or seek an in-person evaluation if the symptoms worsen or if the condition fails to improve as anticipated.  I provided 10 minutes of non-face-to-face time during this encounter.  The patient was located at home.  The provider was located at [facility].   Dorothyann Gibbs, NP   Subjective:   Patient ID:  Dustin Jennings is a 27 y.o. (DOB 1995/08/27) male.  Chief Complaint: No chief complaint on file.   HPI DUSAN LIPFORD presents for follow-up of ADHD.  Describes mood today as "ok". Pleasant. Denies tearfulness. Mood symptoms - denies depression, anxiety, and irritability. Denies panic attacks. Denies worry, rumination and over thinking. Mood is consistent. Stating "I feel like I'm doing good". Feels like current medication works well. Stable interest and motivation. Taking medications as prescribed.  Energy levels stable. Active, has a regular exercise routine - 4 x a week. Enjoys some usual interests and activities. Lives with girlfriend of 10 years in [place]. Spending time with family. Appetite adequate. Weight stable - 160 pounds - 69". Sleeps well most nights. Averages 8 hours. Focus and concentration stable with Adderall. Completing tasks. Managing aspects of household.  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  Denies AH or VH. Denies self harm. Denies substance use.   Previous medication trials:  Denies   Review of Systems:  Review of Systems  Musculoskeletal:  Negative for gait problem.  Neurological:  Negative for tremors.  Psychiatric/Behavioral:         Please refer to HPI    Medications: I have reviewed the patient's current medications.  Current Outpatient Medications  Medication Sig Dispense Refill   amphetamine-dextroamphetamine (ADDERALL) 15 MG tablet Take 1 tablet by mouth 2 (two) times daily. 60 tablet 0   amphetamine-dextroamphetamine (ADDERALL) 15 MG tablet Take 1 tablet by mouth 2 (two) times daily. 60 tablet 0   amphetamine-dextroamphetamine (ADDERALL) 15 MG tablet Take 1 tablet by mouth 2 (two) times daily. 60 tablet 0   No current facility-administered medications for this visit.    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  Social History   Socioeconomic History   Marital status: Single    Spouse name: Not on file   Number of children: Not on file   Years of education: Not on file   Highest education level: Not on file  Occupational History   Not on file  Tobacco Use   Smoking status: Never   Smokeless tobacco: Never  Vaping Use  Vaping status: Never Used  Substance and Sexual Activity   Alcohol use: Yes    Alcohol/week: 2.0 standard drinks of alcohol    Types: 2 Cans of beer per week    Comment: weekly   Drug use: Never   Sexual activity: Not on file  Other Topics Concern   Not on file  Social History Narrative   Not on file   Social Determinants of Health   Financial Resource Strain: Not on file  Food Insecurity: Not  on file  Transportation Needs: Not on file  Physical Activity: Not on file  Stress: Not on file  Social Connections: Not on file  Intimate Partner Violence: Not on file    Past Medical History, Surgical history, Social history, and Family history were reviewed and updated as appropriate.   Please see review of systems for further details on the patient's review from today.   Objective:   Physical Exam:  There were no vitals taken for this visit.  Physical Exam Constitutional:      General: He is not in acute distress. Musculoskeletal:        General: No deformity.  Neurological:     Mental Status: He is alert and oriented to person, place, and time.     Coordination: Coordination normal.  Psychiatric:        Attention and Perception: Attention and perception normal. He does not perceive auditory or visual hallucinations.        Mood and Affect: Mood normal. Mood is not anxious or depressed. Affect is not labile, blunt, angry or inappropriate.        Speech: Speech normal.        Behavior: Behavior normal.        Thought Content: Thought content normal. Thought content is not paranoid or delusional. Thought content does not include homicidal or suicidal ideation. Thought content does not include homicidal or suicidal plan.        Cognition and Memory: Cognition and memory normal.        Judgment: Judgment normal.     Comments: Insight intact     Lab Review:  No results found for: "NA", "K", "CL", "CO2", "GLUCOSE", "BUN", "CREATININE", "CALCIUM", "PROT", "ALBUMIN", "AST", "ALT", "ALKPHOS", "BILITOT", "GFRNONAA", "GFRAA"  No results found for: "WBC", "RBC", "HGB", "HCT", "PLT", "MCV", "MCH", "MCHC", "RDW", "LYMPHSABS", "MONOABS", "EOSABS", "BASOSABS"  No results found for: "POCLITH", "LITHIUM"   No results found for: "PHENYTOIN", "PHENOBARB", "VALPROATE", "CBMZ"   .res Assessment: Plan:    Plan:  PDMP reviewed  Adderall 15mg  BID   RTC 6 months - will call in 3  months for next set of refills.   Patient advised to contact office with any questions, adverse effects, or acute worsening in signs and symptoms.  Discussed potential benefits, risks, and side effects of stimulants with patient to include increased heart rate, palpitations, insomnia, increased anxiety, increased irritability, or decreased appetite.  Instructed patient to contact office if experiencing any significant tolerability issues.   Time spent with patient was 15 minutes. Greater than 50% of face to face time with patient was spent on counseling and coordination of care.    There are no diagnoses linked to this encounter.   Please see After Visit Summary for patient specific instructions.  No future appointments.  No orders of the defined types were placed in this encounter.     -------------------------------

## 2023-05-14 ENCOUNTER — Other Ambulatory Visit: Payer: Self-pay

## 2023-05-14 ENCOUNTER — Telehealth: Payer: Self-pay | Admitting: Adult Health

## 2023-05-14 DIAGNOSIS — F902 Attention-deficit hyperactivity disorder, combined type: Secondary | ICD-10-CM

## 2023-05-14 MED ORDER — AMPHETAMINE-DEXTROAMPHETAMINE 15 MG PO TABS
15.0000 mg | ORAL_TABLET | Freq: Two times a day (BID) | ORAL | 0 refills | Status: DC
Start: 2023-05-14 — End: 2023-06-17

## 2023-05-14 NOTE — Telephone Encounter (Signed)
Pt called asking for a refill on his adderall 15 mg. [facility] [address] in [place]

## 2023-05-14 NOTE — Telephone Encounter (Signed)
Pednded adderall 15 mg to [facility].

## 2023-06-17 ENCOUNTER — Other Ambulatory Visit: Payer: Self-pay

## 2023-06-17 ENCOUNTER — Telehealth: Payer: Self-pay | Admitting: Adult Health

## 2023-06-17 DIAGNOSIS — F902 Attention-deficit hyperactivity disorder, combined type: Secondary | ICD-10-CM

## 2023-06-17 NOTE — Telephone Encounter (Signed)
 Pt called asking for a refill on his adderall 15 mg. [facility] [address] in [place]

## 2023-06-17 NOTE — Telephone Encounter (Signed)
 Pended Adder[facility] 15 [facility], #[facility] [place]

## 2023-06-18 MED ORDER — AMPHETAMINE-DEXTROAMPHETAMINE 15 MG PO TABS
15.0000 mg | ORAL_TABLET | Freq: Two times a day (BID) | ORAL | 0 refills | Status: DC
Start: 2023-08-12 — End: 2023-07-22

## 2023-06-18 MED ORDER — AMPHETAMINE-DEXTROAMPHETAMINE 15 MG PO TABS
15.0000 mg | ORAL_TABLET | Freq: Two times a day (BID) | ORAL | 0 refills | Status: DC
Start: 2023-06-18 — End: 2023-07-22

## 2023-06-18 MED ORDER — AMPHETAMINE-DEXTROAMPHETAMINE 15 MG PO TABS
15.0000 mg | ORAL_TABLET | Freq: Two times a day (BID) | ORAL | 0 refills | Status: DC
Start: 2023-07-15 — End: 2023-07-22

## 2023-07-19 ENCOUNTER — Encounter: Payer: Self-pay | Admitting: Adult Health

## 2023-07-19 ENCOUNTER — Telehealth (INDEPENDENT_AMBULATORY_CARE_PROVIDER_SITE_OTHER): Payer: 59 | Admitting: Adult Health

## 2023-07-19 DIAGNOSIS — Z0389 Encounter for observation for other suspected diseases and conditions ruled out: Secondary | ICD-10-CM

## 2023-07-19 NOTE — Progress Notes (Signed)
 Patient no show appointment. Called and LVM to call and R/S.

## 2023-07-22 ENCOUNTER — Encounter: Payer: Self-pay | Admitting: Adult Health

## 2023-07-22 ENCOUNTER — Telehealth: Admitting: Adult Health

## 2023-07-22 DIAGNOSIS — F909 Attention-deficit hyperactivity disorder, unspecified type: Secondary | ICD-10-CM

## 2023-07-22 DIAGNOSIS — F902 Attention-deficit hyperactivity disorder, combined type: Secondary | ICD-10-CM

## 2023-07-22 MED ORDER — AMPHETAMINE-DEXTROAMPHETAMINE 15 MG PO TABS
15.0000 mg | ORAL_TABLET | Freq: Two times a day (BID) | ORAL | 0 refills | Status: DC
Start: 2023-08-12 — End: 2023-08-05

## 2023-07-22 MED ORDER — AMPHETAMINE-DEXTROAMPHETAMINE 15 MG PO TABS
15.0000 mg | ORAL_TABLET | Freq: Two times a day (BID) | ORAL | 0 refills | Status: DC
Start: 2023-09-16 — End: 2023-08-05

## 2023-07-22 MED ORDER — AMPHETAMINE-DEXTROAMPHETAMINE 15 MG PO TABS
15.0000 mg | ORAL_TABLET | Freq: Two times a day (BID) | ORAL | 0 refills | Status: DC
Start: 2023-07-22 — End: 2023-08-05

## 2023-07-22 NOTE — Progress Notes (Signed)
 Dustin Jennings [id number] 03-31-1996 28 y.o.  Virtual Visit via Video Note  I connected with pt @ on 07/22/23 at  2:00 PM EDT by a video enabled telemedicine application and verified that I am speaking with the correct person using two identifiers.   I discussed the limitations of evaluation and management by telemedicine and the availability of in person appointments. The patient expressed understanding and agreed to proceed.  I discussed the assessment and treatment plan with the patient. The patient was provided an opportunity to ask questions and all were answered. The patient agreed with the plan and demonstrated an understanding of the instructions.   The patient was advised to call back or seek an in-person evaluation if the symptoms worsen or if the condition fails to improve as anticipated.  I provided 15 minutes of non-face-to-face time during this encounter.  The patient was located at home.  The provider was located at [facility].  Dorothyann Gibbs, NP   Subjective:   Patient ID:  Dustin Jennings is a 28 y.o. (DOB 1996-01-30) male.  Chief Complaint: No chief complaint on file.   HPI DONAVIN AUDINO presents for follow-up of ADHD.  Describes mood today as "ok". Pleasant. Denies tearfulness. Mood symptoms - denies depression, anxiety, and irritability. Denies panic attacks. Denies worry, rumination and over thinking. Mood is consistent. Stating "I feel like I'm doing well". Feels like current medication works well. Stable interest and motivation. Taking medications as prescribed.  Energy levels stable. Active, has a regular exercise routine - working with [employer].  Enjoys some usual interests and activities. Lives with girlfriend of 10 years in [place]. Spending time with family. Appetite adequate. Weight loss 151 from 160 pounds - 69". Sleeps well most nights. Averages 7 to 8 hours. Focus and concentration stable with Adderall. Completing tasks. Managing  aspects of household. 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  Denies AH or VH. Denies self harm. Denies substance use.  Previous medication trials:  Denies  Review of Systems:  Review of Systems  Musculoskeletal:  Negative for gait problem.  Neurological:  Negative for tremors.  Psychiatric/Behavioral:         Please refer to HPI    Medications: I have reviewed the patient's current medications.  Current Outpatient Medications  Medication Sig Dispense Refill   amphetamine-dextroamphetamine (ADDERALL) 15 MG tablet Take 1 tablet by mouth 2 (two) times daily. 60 tablet 0   [START ON 08/12/2023] amphetamine-dextroamphetamine (ADDERALL) 15 MG tablet Take 1 tablet by mouth 2 (two) times daily. 60 tablet 0   amphetamine-dextroamphetamine (ADDERALL) 15 MG tablet Take 1 tablet by mouth 2 (two) times daily. 60 tablet 0   No current facility-administered medications for this visit.    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  Social History   Socioeconomic History   Marital status: Single    Spouse name: Not on file   Number of children: Not on file   Years of education: Not on file   Highest education level: Not on file  Occupational History   Not on file  Tobacco Use   Smoking status: Never   Smokeless tobacco: Never  Vaping Use   Vaping status: Never Used  Substance and Sexual Activity   Alcohol use: Yes    Alcohol/week: 2.0 standard drinks of alcohol    Types: 2 Cans of beer per week    Comment: weekly   Drug use: Never   Sexual activity: Not on file  Other Topics Concern   Not on file  Social History Narrative   Not on file   Social Drivers of Health   Financial Resource Strain:  Not on file  Food Insecurity: Not on file  Transportation Needs: Not on file  Physical Activity: Not on file  Stress: Not on file  Social Connections: Not on file  Intimate Partner Violence: Not on file    Past Medical History, Surgical history, Social history, and Family history were reviewed and updated as appropriate.   Please see review of systems for further details on the patient's review from today.   Objective:   Physical Exam:  There were no vitals taken for this visit.  Physical Exam Constitutional:      General: He is not in acute distress. Musculoskeletal:        General: No deformity.  Neurological:     Mental Status: He is alert and oriented to person, place, and time.     Coordination: Coordination normal.  Psychiatric:        Attention and Perception: Attention and perception normal. He does not perceive auditory or visual hallucinations.        Mood and Affect: Mood normal. Mood is not anxious or depressed. Affect is not labile, blunt, angry or inappropriate.        Speech: Speech normal.        Behavior: Behavior normal.        Thought Content: Thought content normal. Thought content is not paranoid or delusional. Thought content does not include homicidal or suicidal ideation. Thought content does not include homicidal or suicidal plan.        Cognition and Memory: Cognition and memory normal.        Judgment: Judgment normal.     Comments: Insight intact     Lab Review:  No results found for: "NA", "K", "CL", "CO2", "GLUCOSE", "BUN", "CREATININE", "CALCIUM", "PROT", "ALBUMIN", "AST", "ALT", "ALKPHOS", "BILITOT", "GFRNONAA", "GFRAA"  No results found for: "WBC", "RBC", "HGB", "HCT", "PLT", "MCV", "MCH", "MCHC", "RDW", "LYMPHSABS", "MONOABS", "EOSABS", "BASOSABS"  No results found for: "POCLITH", "LITHIUM"   No results found for: "PHENYTOIN", "PHENOBARB", "VALPROATE", "CBMZ"   .res Assessment: Plan:    Plan:  PDMP reviewed  Adderall 15mg  BID    RTC 3 months  15 minutes spent dedicated to the care of this patient on the date of this encounter to include pre-visit review of records, ordering of medication, post visit documentation, and face-to-face time with the patient discussing ADD.  Discussed continuing current medication regimen.  Patient advised to contact office with any questions, adverse effects, or acute worsening in signs and symptoms.  Discussed potential benefits, risks, and side effects of stimulants with patient to include increased heart rate, palpitations, insomnia, increased anxiety, increased irritability, or decreased appetite.  Instructed patient to contact office if experiencing any significant tolerability issues.   There are no diagnoses linked to this encounter.   Please see After Visit Summary for patient specific instructions.  Future Appointments  Date Time Provider Department Center  07/22/2023  2:00 PM Ankita Newcomer, Thereasa Solo, NP [facility] None    No orders of the defined types were placed in  this encounter.     -------------------------------

## 2023-08-05 ENCOUNTER — Other Ambulatory Visit: Payer: Self-pay

## 2023-08-05 ENCOUNTER — Telehealth: Payer: Self-pay | Admitting: Adult Health

## 2023-08-05 DIAGNOSIS — F902 Attention-deficit hyperactivity disorder, combined type: Secondary | ICD-10-CM

## 2023-08-05 NOTE — Telephone Encounter (Signed)
 Pended RF to [facility]. Updated pharmacy profile. Need to cancel RF at [phone].

## 2023-08-05 NOTE — Telephone Encounter (Signed)
 Pt lvm stating that his adderall script was sent to wrong pharmacy. He lives in [place] [facility] [place] [facility] [address]. Please cancel and [facility]end all [facility] scripts [facility] [address] in [place], 

## 2023-08-06 MED ORDER — AMPHETAMINE-DEXTROAMPHETAMINE 15 MG PO TABS: 15.0000 mg | ORAL_TABLET | Freq: Two times a day (BID) | ORAL | 0 refills | Status: DC

## 2023-08-06 NOTE — Telephone Encounter (Signed)
 Canceled RF.

## 2023-10-21 ENCOUNTER — Telehealth: Admitting: Adult Health

## 2023-10-21 ENCOUNTER — Encounter: Payer: Self-pay | Admitting: Adult Health

## 2023-10-21 DIAGNOSIS — F902 Attention-deficit hyperactivity disorder, combined type: Secondary | ICD-10-CM | POA: Diagnosis not present

## 2023-10-21 MED ORDER — AMPHETAMINE-DEXTROAMPHETAMINE 15 MG PO TABS: 15.0000 mg | ORAL_TABLET | Freq: Two times a day (BID) | ORAL | 0 refills | Status: DC

## 2023-10-21 NOTE — Progress Notes (Signed)
 Dustin Jennings [id number] 1995/05/29 28 y.o.  Virtual Visit via Video Note  I connected with pt @ on 10/21/23 at 10:00 AM EDT by a video enabled telemedicine application and verified that I am speaking with the correct person using two identifiers.   I discussed the limitations of evaluation and management by telemedicine and the availability of in person appointments. The patient expressed understanding and agreed to proceed.  I discussed the assessment and treatment plan with the patient. The patient was provided an opportunity to ask questions and all were answered. The patient agreed with the plan and demonstrated an understanding of the instructions.   The patient was advised to call back or seek an in-person evaluation if the symptoms worsen or if the condition fails to improve as anticipated.  I provided 15 minutes of non-face-to-face time during this encounter.  The patient was located at home.  The provider was located at [facility].   Angeline Dustin Sayers, NP   Subjective:   Patient ID:  Dustin Jennings is a 28 y.o. (DOB July 29, 1995) male.  Chief Complaint: No chief complaint on file.   HPI Dustin Jennings presents for follow-up of ADHD.  Describes mood today as ok. Pleasant. Denies tearfulness. Mood symptoms - denies depression, anxiety and irritability. Reports stable interest and motivation. Denies panic attacks. Denies worry, rumination and over thinking. Reports mood is stable. Stating I feel like I'm doing good. Feels like current medication works well. Taking medications as prescribed.  Energy levels stable. Active, has a regular exercise routine - working with P/T twice a week. Enjoys some usual interests and activities. Lives with girlfriend of 10 years in [place] - dog. Spending time with family. Appetite adequate. Weight 155 pounds - 69. Sleeps well most nights. Averages 7 to 8 hours. Focus and concentration stable with Adderall. Completing tasks.  Managing aspects of household. 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  Denies AH or VH. Denies self harm. Denies substance use.  Previous medication trials:  Denies  Review of Systems:  Review of Systems  Musculoskeletal:  Negative for gait problem.  Neurological:  Negative for tremors.  Psychiatric/Behavioral:         Please refer to HPI    Medications: I have reviewed the patient's current medications.  Current Outpatient Medications  Medication Sig Dispense Refill   amphetamine -dextroamphetamine  (ADDERALL) 15 MG tablet Take 1 tablet by mouth 2 (two) times daily. 60 tablet 0   amphetamine -dextroamphetamine  (ADDERALL) 15 MG tablet Take 1 tablet by mouth 2 (two) times daily. 60 tablet 0   amphetamine -dextroamphetamine  (ADDERALL) 15 MG tablet Take 1 tablet by mouth 2 (two) times daily. 60 tablet 0   No current facility-administered medications for this visit.    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  Social History   Socioeconomic History   Marital status: Single    Spouse name: Not on file   Number of children: Not on file   Years of education: Not on file   Highest education level: Not on file  Occupational History   Not on file  Tobacco Use   Smoking status: Never   Smokeless tobacco: Never  Vaping Use   Vaping status: Never Used  Substance and Sexual Activity   Alcohol use: Yes    Alcohol/week: 2.0 standard drinks of alcohol    Types: 2 Cans of beer per week    Comment: weekly   Drug use: Never   Sexual activity: Not on file  Other Topics Concern   Not on file  Social History Narrative   Not on file   Social Drivers of Health   Financial Resource Strain: Not on file   Food Insecurity: Not on file  Transportation Needs: Not on file  Physical Activity: Not on file  Stress: Not on file  Social Connections: Not on file  Intimate Partner Violence: Not on file    Past Medical History, Surgical history, Social history, and Family history were reviewed and updated as appropriate.   Please see review of systems for further details on the patient's review from today.   Objective:   Physical Exam:  There were no vitals taken for this visit.  Physical Exam Constitutional:      General: He is not in acute distress. Musculoskeletal:        General: No deformity.  Neurological:     Mental Status: He is alert and oriented to person, place, and time.     Coordination: Coordination normal.  Psychiatric:        Attention and Perception: Attention and perception normal. He does not perceive auditory or visual hallucinations.        Mood and Affect: Mood normal. Mood is not anxious or depressed. Affect is not labile, blunt, angry or inappropriate.        Speech: Speech normal.        Behavior: Behavior normal.        Thought Content: Thought content normal. Thought content is not paranoid or delusional. Thought content does not include homicidal or suicidal ideation. Thought content does not include homicidal or suicidal plan.        Cognition and Memory: Cognition and memory normal.        Judgment: Judgment normal.     Comments: Insight intact     Lab Review:  No results found for: NA, K, CL, CO2, GLUCOSE, BUN, CREATININE, CALCIUM, PROT, ALBUMIN, AST, ALT, ALKPHOS, BILITOT, GFRNONAA, GFRAA  No results found for: WBC, RBC, HGB, HCT, PLT, MCV, MCH, MCHC, RDW, LYMPHSABS, MONOABS, EOSABS, BASOSABS  No results found for: POCLITH, LITHIUM   No results found for: PHENYTOIN, PHENOBARB, VALPROATE, CBMZ   .res Assessment: Plan:    Plan:  PDMP reviewed  Adderall 15mg  BID   RTC 3  months  15 minutes spent dedicated to the care of this patient on the date of this encounter to include pre-visit review of records, ordering of medication, post visit documentation, and face-to-face time with the patient discussing ADD.  Discussed continuing current medication regimen.  Patient advised to contact office with any questions, adverse effects, or acute worsening in signs and symptoms.  Discussed potential benefits, risks, and side effects of stimulants with patient to include increased heart rate, palpitations, insomnia, increased anxiety, increased irritability, or decreased appetite.  Instructed patient to contact office if experiencing any significant tolerability issues.   There are no diagnoses linked to this encounter.   Please see After Visit Summary for patient specific instructions.  Future Appointments  Date Time Provider Department Center  10/21/2023 10:00 AM Genine Beckett Nattalie, NP [facility] None    No orders of the defined types were placed in this  encounter.     -------------------------------

## 2024-01-20 ENCOUNTER — Telehealth (INDEPENDENT_AMBULATORY_CARE_PROVIDER_SITE_OTHER): Payer: Self-pay | Admitting: Adult Health

## 2024-01-20 ENCOUNTER — Encounter: Payer: Self-pay | Admitting: Adult Health

## 2024-01-20 DIAGNOSIS — Z0389 Encounter for observation for other suspected diseases and conditions ruled out: Secondary | ICD-10-CM

## 2024-01-20 NOTE — Progress Notes (Signed)
 Patient no show appointment. Did not connect for video appt. Called and LVM.

## 2024-02-14 ENCOUNTER — Encounter: Payer: Self-pay | Admitting: Adult Health

## 2024-02-14 ENCOUNTER — Telehealth: Admitting: Adult Health

## 2024-02-14 DIAGNOSIS — F902 Attention-deficit hyperactivity disorder, combined type: Secondary | ICD-10-CM

## 2024-02-14 DIAGNOSIS — F909 Attention-deficit hyperactivity disorder, unspecified type: Secondary | ICD-10-CM | POA: Diagnosis not present

## 2024-02-14 MED ORDER — AMPHETAMINE-DEXTROAMPHETAMINE 15 MG PO TABS: 15.0000 mg | ORAL_TABLET | Freq: Two times a day (BID) | ORAL | 0 refills | Status: AC

## 2024-02-14 NOTE — Progress Notes (Signed)
 Dustin Jennings [id number] 1995/08/06 28 y.o.  Virtual Visit via Video Note  I connected with pt @ on 02/14/24 at  9:00 AM EDT by a video enabled telemedicine application and verified that I am speaking with the correct person using two identifiers.   I discussed the limitations of evaluation and management by telemedicine and the availability of in person appointments. The patient expressed understanding and agreed to proceed.  I discussed the assessment and treatment plan with the patient. The patient was provided an opportunity to ask questions and all were answered. The patient agreed with the plan and demonstrated an understanding of the instructions.   The patient was advised to call back or seek an in-person evaluation if the symptoms worsen or if the condition fails to improve as anticipated.  I provided 10 minutes of non-face-to-face time during this encounter.  The patient was located at home.  The provider was located at [facility].   Angeline LOISE Sayers, NP   Subjective:   Patient ID:  Dustin Jennings is a 28 y.o. (DOB 07-12-1995) male.  Chief Complaint: No chief complaint on file.   HPI Dustin Jennings presents for follow-up of ADHD.  Describes mood today as ok. Pleasant. Denies tearfulness. Mood symptoms - denies depression, anxiety and irritability. Reports stable interest and motivation. Denies panic attacks. Denies worry, rumination and over thinking. Reports mood is stable. Stating I feel like I'm doing pretty good. Feels like current medication works well. Taking medications as prescribed.  Energy levels stable. Active, has a regular exercise routine - working with P/T twice a week. Enjoys some usual interests and activities. Lives with fiance of 10 years in [place] - dog. Spending time with family. Appetite adequate. Weight 155 pounds - 70. Sleeps well most nights. Averages 7 to 8 hours. Focus and concentration stable with Adderall. Completing  tasks. Managing aspects of household. Work going well ship broker. 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  Denies AH or VH. Denies self harm. Denies substance use.  Previous medication trials:  Denies   Review of Systems:  Review of Systems  Musculoskeletal:  Negative for gait problem.  Neurological:  Negative for tremors.  Psychiatric/Behavioral:         Please refer to HPI    Medications: I have reviewed the patient's current medications.  Current Outpatient Medications  Medication Sig Dispense Refill   amphetamine -dextroamphetamine  (ADDERALL) 15 MG tablet Take 1 tablet by mouth 2 (two) times daily. 60 tablet 0   amphetamine -dextroamphetamine  (ADDERALL) 15 MG tablet Take 1 tablet by mouth 2 (two) times daily. 60 tablet 0   amphetamine -dextroamphetamine  (ADDERALL) 15 MG tablet Take 1 tablet by mouth 2 (two) times daily. 60 tablet 0   No current facility-administered medications for this visit.    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  Social History   Socioeconomic History   Marital status: Single    Spouse name: Not on file   Number of children: Not on file   Years of education: Not on file   Highest education level: Not on file  Occupational History   Not on file  Tobacco Use   Smoking status: Never   Smokeless  tobacco: Never  Vaping Use   Vaping status: Never Used  Substance and Sexual Activity   Alcohol use: Yes    Alcohol/week: 2.0 standard drinks of alcohol    Types: 2 Cans of beer per week    Comment: weekly   Drug use: Never   Sexual activity: Not on file  Other Topics Concern   Not on file  Social History Narrative   Not on file   Social Drivers of Health   Financial Resource Strain: Not  on file  Food Insecurity: Not on file  Transportation Needs: Not on file  Physical Activity: Not on file  Stress: Not on file  Social Connections: Not on file  Intimate Partner Violence: Not on file    Past Medical History, Surgical history, Social history, and Family history were reviewed and updated as appropriate.   Please see review of systems for further details on the patient's review from today.   Objective:   Physical Exam:  There were no vitals taken for this visit.  Physical Exam Constitutional:      General: He is not in acute distress. Musculoskeletal:        General: No deformity.  Neurological:     Mental Status: He is alert and oriented to person, place, and time.     Coordination: Coordination normal.  Psychiatric:        Attention and Perception: Attention and perception normal. He does not perceive auditory or visual hallucinations.        Mood and Affect: Mood normal. Mood is not anxious or depressed. Affect is not labile, blunt, angry or inappropriate.        Speech: Speech normal.        Behavior: Behavior normal.        Thought Content: Thought content normal. Thought content is not paranoid or delusional. Thought content does not include homicidal or suicidal ideation. Thought content does not include homicidal or suicidal plan.        Cognition and Memory: Cognition and memory normal.        Judgment: Judgment normal.     Comments: Insight intact     Lab Review:  No results found for: NA, K, CL, CO2, GLUCOSE, BUN, CREATININE, CALCIUM, PROT, ALBUMIN, AST, ALT, ALKPHOS, BILITOT, GFRNONAA, GFRAA  No results found for: WBC, RBC, HGB, HCT, PLT, MCV, MCH, MCHC, RDW, LYMPHSABS, MONOABS, EOSABS, BASOSABS  No results found for: POCLITH, LITHIUM   No results found for: PHENYTOIN, PHENOBARB, VALPROATE, CBMZ   .res Assessment: Plan:    Plan:  PDMP reviewed  Adderall 15mg  BID   RTC  6 months  15 minutes spent dedicated to the care of this patient on the date of this encounter to include pre-visit review of records, ordering of medication, post visit documentation, and face-to-face time with the patient discussing ADD. Discussed continuing current medication regimen.  Patient advised to contact office with any questions, adverse effects, or acute worsening in signs and symptoms.  Discussed potential benefits, risks, and side effects of stimulants with patient to include increased heart rate, palpitations, insomnia, increased anxiety, increased irritability, or decreased appetite.  Instructed patient to contact office if experiencing any significant tolerability issues.   There are no diagnoses linked to this encounter.   Please see After Visit Summary for patient specific instructions.  Future Appointments  Date Time Provider Department Center  02/14/2024  9:00 AM Henslee Lottman Nattalie, NP [facility] None    No orders of the defined types were  placed in this encounter.     -------------------------------

## 2024-08-10 ENCOUNTER — Telehealth: Admitting: Adult Health
